# Patient Record
Sex: Female | Born: 1940 | Race: White | Hispanic: No | Marital: Married | Smoking: Never smoker
Health system: Southern US, Community
[De-identification: ages and names within clinical notes are randomized; demographics above are authoritative.]

## PROBLEM LIST (undated history)

## (undated) DIAGNOSIS — I10 Essential (primary) hypertension: Secondary | ICD-10-CM

## (undated) DIAGNOSIS — E7800 Pure hypercholesterolemia, unspecified: Secondary | ICD-10-CM

---

## 2019-04-06 ENCOUNTER — Other Ambulatory Visit: Payer: Self-pay

## 2019-04-06 ENCOUNTER — Encounter (HOSPITAL_BASED_OUTPATIENT_CLINIC_OR_DEPARTMENT_OTHER): Payer: Self-pay | Admitting: *Deleted

## 2019-04-06 ENCOUNTER — Emergency Department (HOSPITAL_BASED_OUTPATIENT_CLINIC_OR_DEPARTMENT_OTHER)
Admission: EM | Admit: 2019-04-06 | Discharge: 2019-04-06 | Disposition: A | Discharge status: Home / Self Care | Payer: Medicare Other | Attending: Emergency Medicine | Admitting: Emergency Medicine

## 2019-04-06 ENCOUNTER — Emergency Department (HOSPITAL_BASED_OUTPATIENT_CLINIC_OR_DEPARTMENT_OTHER): Payer: Medicare Other

## 2019-04-06 DIAGNOSIS — Z79899 Other long term (current) drug therapy: Secondary | ICD-10-CM | POA: Insufficient documentation

## 2019-04-06 DIAGNOSIS — K1120 Sialoadenitis, unspecified: Secondary | ICD-10-CM | POA: Insufficient documentation

## 2019-04-06 DIAGNOSIS — R600 Localized edema: Secondary | ICD-10-CM | POA: Diagnosis present

## 2019-04-06 DIAGNOSIS — I10 Essential (primary) hypertension: Secondary | ICD-10-CM | POA: Diagnosis not present

## 2019-04-06 DIAGNOSIS — Z7902 Long term (current) use of antithrombotics/antiplatelets: Secondary | ICD-10-CM | POA: Insufficient documentation

## 2019-04-06 HISTORY — DX: Pure hypercholesterolemia, unspecified: E78.00

## 2019-04-06 HISTORY — DX: Essential (primary) hypertension: I10

## 2019-04-06 LAB — CBC WITH DIFFERENTIAL/PLATELET
Abs Immature Granulocytes: 0 10*3/uL (ref 0.00–0.07)
Basophils Absolute: 0 10*3/uL (ref 0.0–0.1)
Basophils Relative: 0 %
Eosinophils Absolute: 0.1 10*3/uL (ref 0.0–0.5)
Eosinophils Relative: 2 %
HCT: 35.6 % — ABNORMAL LOW (ref 36.0–46.0)
Hemoglobin: 12.2 g/dL (ref 12.0–15.0)
Immature Granulocytes: 0 %
Lymphocytes Relative: 24 %
Lymphs Abs: 0.9 10*3/uL (ref 0.7–4.0)
MCH: 33.6 pg (ref 26.0–34.0)
MCHC: 34.3 g/dL (ref 30.0–36.0)
MCV: 98.1 fL (ref 80.0–100.0)
Monocytes Absolute: 0.3 10*3/uL (ref 0.1–1.0)
Monocytes Relative: 9 %
Neutro Abs: 2.5 10*3/uL (ref 1.7–7.7)
Neutrophils Relative %: 65 %
Platelets: 154 10*3/uL (ref 150–400)
RBC: 3.63 MIL/uL — ABNORMAL LOW (ref 3.87–5.11)
RDW: 11.9 % (ref 11.5–15.5)
WBC: 3.8 10*3/uL — ABNORMAL LOW (ref 4.0–10.5)
nRBC: 0 % (ref 0.0–0.2)

## 2019-04-06 LAB — COMPREHENSIVE METABOLIC PANEL
ALT: 11 U/L (ref 0–44)
AST: 17 U/L (ref 15–41)
Albumin: 3.9 g/dL (ref 3.5–5.0)
Alkaline Phosphatase: 52 U/L (ref 38–126)
Anion gap: 7 (ref 5–15)
BUN: 15 mg/dL (ref 8–23)
CO2: 25 mmol/L (ref 22–32)
Calcium: 8.9 mg/dL (ref 8.9–10.3)
Chloride: 105 mmol/L (ref 98–111)
Creatinine, Ser: 0.8 mg/dL (ref 0.44–1.00)
GFR calc Af Amer: >60 mL/min (ref >60)
GFR calc non Af Amer: >60 mL/min (ref >60)
Glucose, Bld: 85 mg/dL (ref 70–99)
Potassium: 3.8 mmol/L (ref 3.5–5.1)
Sodium: 137 mmol/L (ref 135–145)
Total Bilirubin: 0.6 mg/dL (ref 0.3–1.2)
Total Protein: 7.6 g/dL (ref 6.5–8.1)

## 2019-04-06 MED ORDER — CLINDAMYCIN PHOSPHATE 600 MG/50ML IV SOLN: 600.0000 mg | Freq: Once | INTRAVENOUS | Status: DC

## 2019-04-06 MED ORDER — CLINDAMYCIN HCL 150 MG PO CAPS
300.0000 mg | ORAL_CAPSULE | Freq: Once | ORAL | Status: AC
  Administered 2019-04-06: 300 mg via ORAL
  Filled 2019-04-06: qty 2

## 2019-04-06 MED ORDER — SODIUM CHLORIDE 0.9 % IV BOLUS
1000.0000 mL | Freq: Once | INTRAVENOUS | Status: AC
  Administered 2019-04-06: 19:00:00 1000 mL via INTRAVENOUS

## 2019-04-06 MED ORDER — CLINDAMYCIN HCL 300 MG PO CAPS: 300.0000 mg | ORAL_CAPSULE | Freq: Three times a day (TID) | ORAL | 0 refills | Status: AC

## 2019-04-06 MED ORDER — IOHEXOL 300 MG/ML  SOLN
100.0000 mL | Freq: Once | INTRAMUSCULAR | Status: AC | PRN
  Administered 2019-04-06: 20:00:00 75 mL via INTRAVENOUS

## 2019-04-06 NOTE — Discharge Instructions (Signed)
Take clindamycin three times daily for a week   Your parotid gland is inflammed   See your doctor  Return to ER if you have worse neck swelling, severe pain, trouble swallowing

## 2019-04-06 NOTE — ED Triage Notes (Signed)
Knot behind her right ear. Headache. Nausea.

## 2019-04-06 NOTE — ED Notes (Signed)
Patient transported to X-ray 

## 2019-04-06 NOTE — ED Provider Notes (Signed)
[facility] EMERGENCY DEPARTMENT Provider Note   CSN: [id number] Arrival date & time: 04/06/19  1836    History   Chief Complaint No chief complaint on file.   HPI Lori Yates is a 78 y.o. female history of hypertension, high cholesterol here presenting with right jaw pain.  Patient states that she noticed swelling behind her ear and her jaw area today.  She states that she has some subjective trouble swallowing food but able to tolerate liquids.  Patient has some nausea and headaches but denies any vomiting or weakness or numbness or trouble speaking.     The history is provided by the patient.    Past Medical History:  Diagnosis Date  . High cholesterol   . Hypertension     There are no active problems to display for this patient.   History reviewed. No pertinent surgical history.   OB History   No obstetric history on file.      Home Medications    Prior to Admission medications   Medication Sig Start Date End Date Taking? Authorizing Provider  atorvastatin (LIPITOR) 20 MG tablet TAKE 1 TABLET BY MOUTH NIGHTLY FOR CHOLESTEROL 03/07/19  Yes [provider]  carbamazepine (TEGRETOL) 100 MG chewable tablet Chew by mouth. 12/21/18  Yes [provider]  clopidogrel (PLAVIX) 75 MG tablet TAKE 1 TABLET BY MOUTH EVERY DAY 01/29/19  Yes [provider]  lisinopril (ZESTRIL) 20 MG tablet Take by mouth. 02/06/19 02/06/20 Yes [provider]  metoprolol tartrate (LOPRESSOR) 50 MG tablet TAKE 1 TABLET BY MOUTH 2 TIMES A DAY 02/22/19  Yes [provider]  potassium chloride SA (K-DUR) 20 MEQ tablet TAKE 1/2 TABLET BY MOUTH TWICE PER DAY 11/27/18  Yes [provider]    Family History No family history on file.  Social History Social History   Tobacco Use  . Smoking status: Never Smoker  . Smokeless tobacco: Never Used  Substance Use Topics  . Alcohol use: Not Currently    Frequency: Never  . Drug use: Never      Allergies   Penicillin g   Review of Systems Review of Systems  HENT: Positive for facial swelling and trouble swallowing.   All other systems reviewed and are negative.    Physical Exam Updated Vital Signs BP (!) 182/97   Pulse 80   Temp 97.8 F (36.6 C) (Oral)   Resp 20   Ht 5\' 2"  (1.575 m)   Wt 65.8 kg   SpO2 100%   BMI 26.52 kg/m   Physical Exam Vitals signs and nursing note reviewed.  HENT:     Head: Normocephalic.     Right Ear: Tympanic membrane normal.     Left Ear: Tympanic membrane normal.     Ears:     Comments: No obvious otitis media     Mouth/Throat:     Mouth: Mucous membranes are moist.     Comments: Good dentition overall. No obvious periapical abscess, floor of mouth soft nontender  Eyes:     Extraocular Movements: Extraocular movements intact.     Pupils: Pupils are equal, round, and reactive to light.  Neck:     Musculoskeletal: Normal range of motion.     Comments: Tender lymph node ankle of the jaw on R side  Cardiovascular:     Rate and Rhythm: Normal rate.     Pulses: Normal pulses.  Pulmonary:     Effort: Pulmonary effort is normal.  Abdominal:  General: Abdomen is flat.     Palpations: Abdomen is soft.  Musculoskeletal: Normal range of motion.  Skin:    General: Skin is warm.     Capillary Refill: Capillary refill takes less than 2 seconds.  Neurological:     General: No focal deficit present.     Mental Status: She is alert and oriented to person, place, and time.  Psychiatric:        Mood and Affect: Mood normal.        Behavior: Behavior normal.      ED Treatments / Results  Labs (all labs ordered are listed, but only abnormal results are displayed) Labs Reviewed  CBC WITH DIFFERENTIAL/PLATELET - Abnormal; Notable for the following components:      Result Value   WBC 3.8 (*)    RBC 3.63 (*)    HCT 35.6 (*)    All other components within normal limits  COMPREHENSIVE METABOLIC PANEL    EKG None   Radiology No results found.  Procedures Procedures (including critical care time)  Medications Ordered in ED Medications  sodium chloride 0.9 % bolus 1,000 mL (1,000 mLs Intravenous New Bag/Given 04/06/19 1926)     Initial Impression / Assessment and Plan / ED Course  I have reviewed the triage vital signs and the nursing notes.  Pertinent labs & imaging results that were available during my care of the patient were reviewed by me and considered in my medical decision making (see chart for details).       Lori Yates is a 78 y.o. female here with R sided neck swelling. No obvious otitis media. Consider salivary duct stone vs parotitis. No obvious periapical abscess. Given that she has subjective trouble swallowing, will get labs, CT neck to r/o mass vs deep space infection.   8:44 PM Labs unremarkable. CT showed parotitis with no stone. Likely viral in etiology and she is up to date with tdap. Will give clindamycin empirically. Stable for discharge.    Final Clinical Impressions(s) / ED Diagnoses   Final diagnoses:  None    ED Discharge Orders    None       Charlynne PanderYao, David Hsienta, MD 04/06/19 2046

## 2019-04-06 NOTE — ED Notes (Signed)
Patient transported to CT 

## 2019-04-06 NOTE — ED Notes (Signed)
Pt was able to ambulate to restroom without assistance. NAD noted 

## 2019-04-06 NOTE — ED Notes (Signed)
Pt understood dc material. NAD noted. Script given at dc. All questions answered to satisfaction. Pt escorted to check out window. 

## 2019-04-06 NOTE — ED Notes (Signed)
Pt requested not to finish IVF. States she has a terrible time with her bladder. EDP Silverio Lay notified and stated we could discontinue the remainder of the fluids.

## 2020-11-04 IMAGING — CT CT NECK WITH CONTRAST
3 of 4 series · 14 of 33 positions shown, 17 images · IV contrast (omnipaque)
Comparison: None.

CLINICAL DATA: Right-sided facial/jaw swelling. Nausea. Pain when
eating.

EXAM:
CT NECK WITH CONTRAST
TECHNIQUE: Multidetector CT imaging of the neck was performed using the
standard protocol following the bolus administration of intravenous
contrast.
CONTRAST:  75mL OMNIPAQUE IOHEXOL 300 MG/ML  SOLN

[Series 6: sag neck · sagittal · 0.47mm/px · 5 of 123 slices shown, 6 images]
[im 41/123  bone]
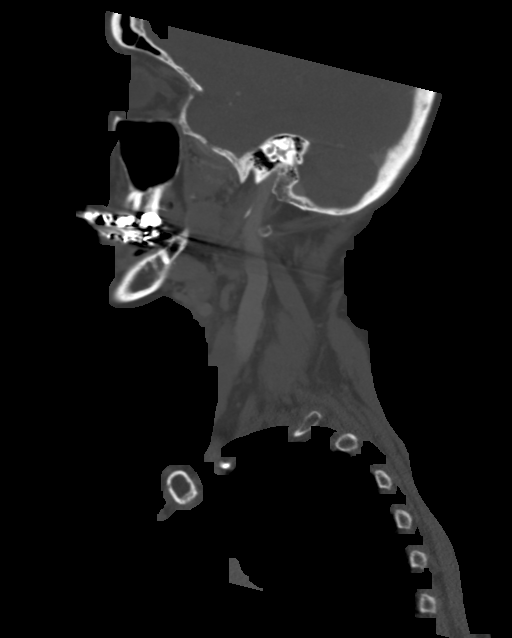
[im 51/123  bone]
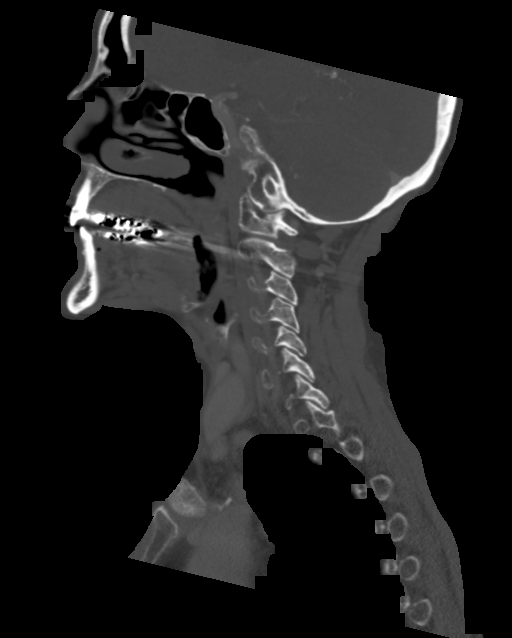
[im 62/123  soft-tissue]
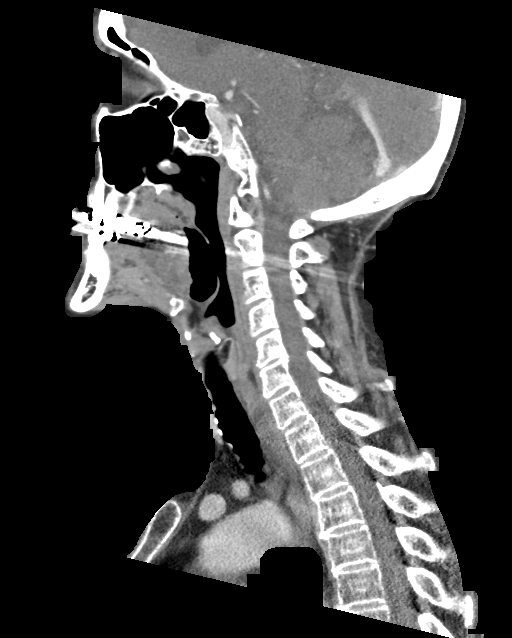
[im 62/123  bone]
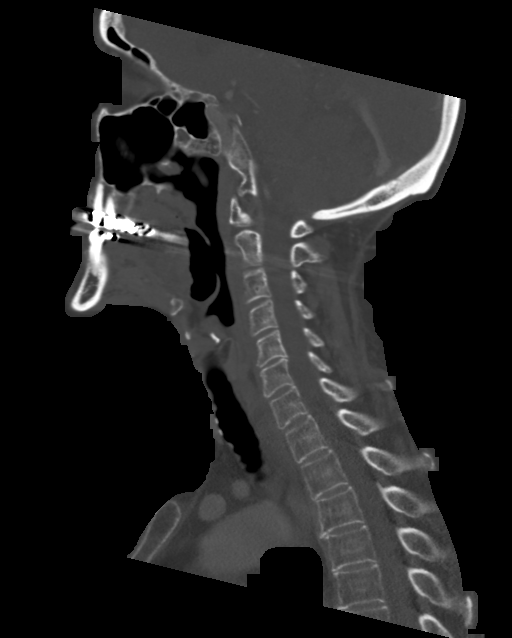
[im 72/123  bone]
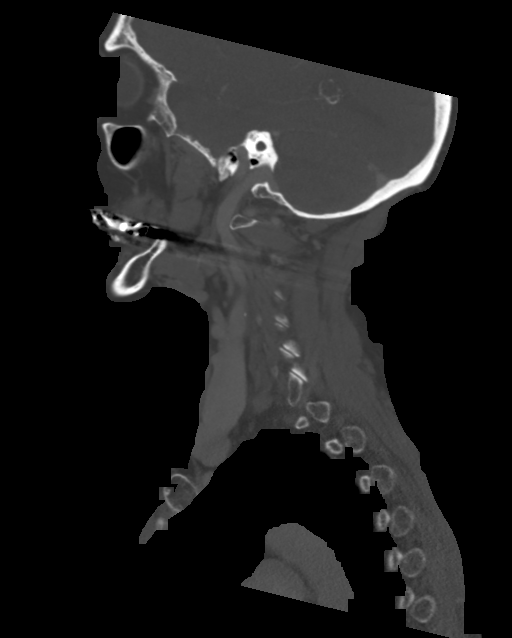
[im 82/123  bone]
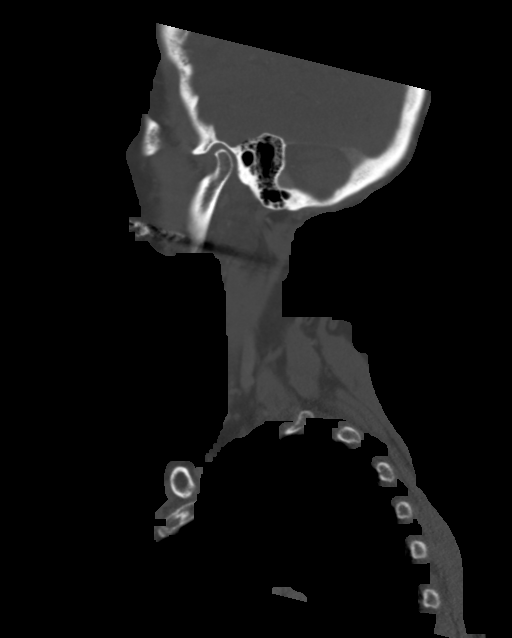

[Series 7: cor neck · coronal · 0.48mm/px · 3 of 100 slices shown]
[im 20/100  bone]
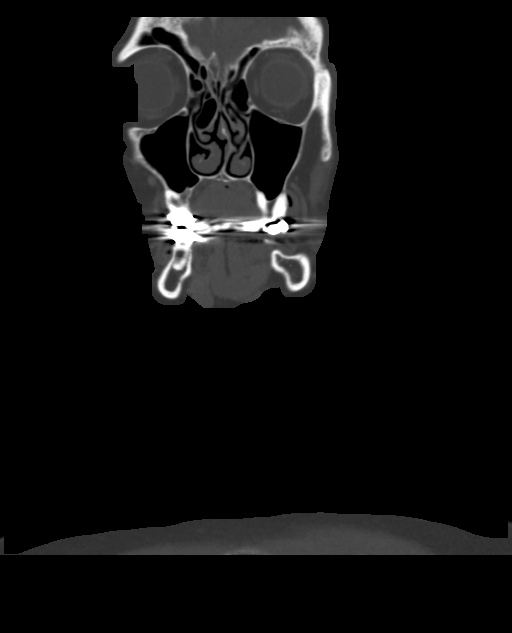
[im 40/100  bone]
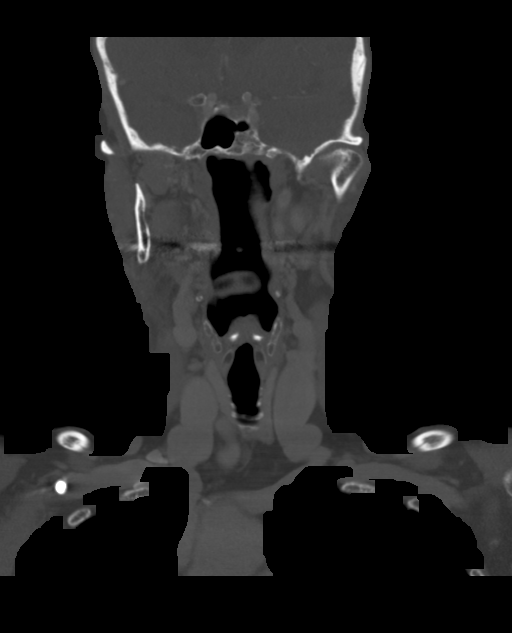
[im 60/100  bone]
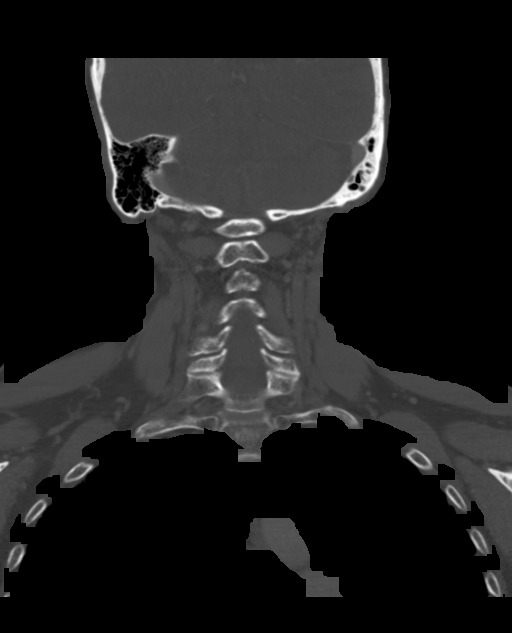

[Series 8: orthogonal ax · axial · 0.47mm/px · z∈[+954,+1163]mm · 6 of 152 slices shown, 8 images]
[im 22/152  soft-tissue]
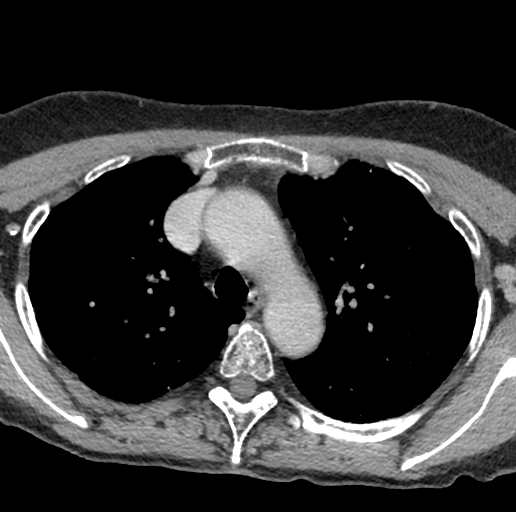
[im 22/152  bone]
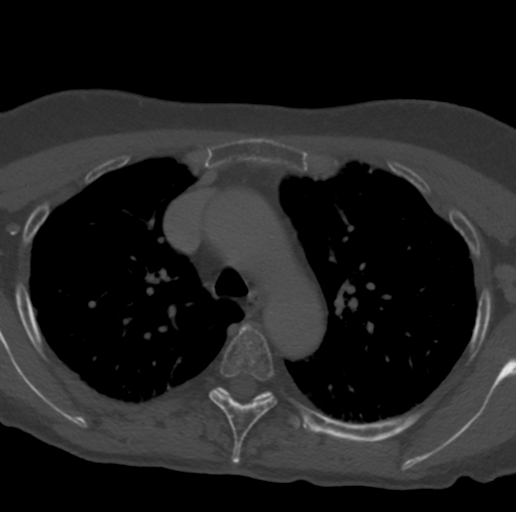
[im 44/152  bone]
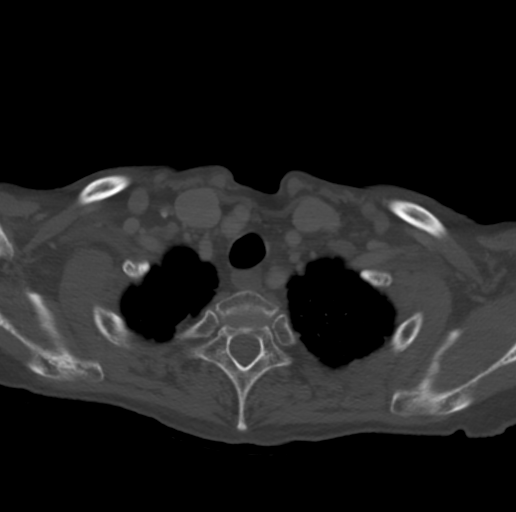
[im 65/152  bone]
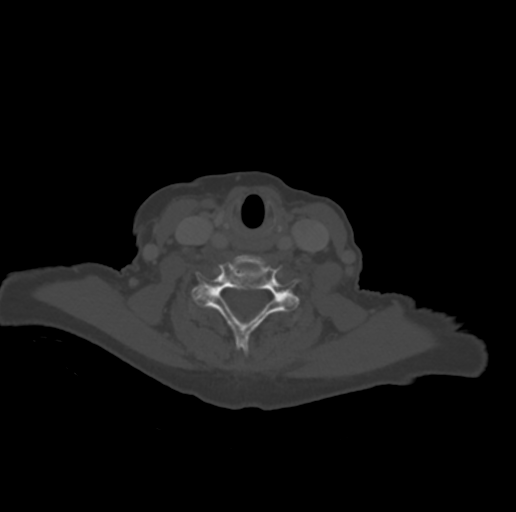
[im 87/152  bone]
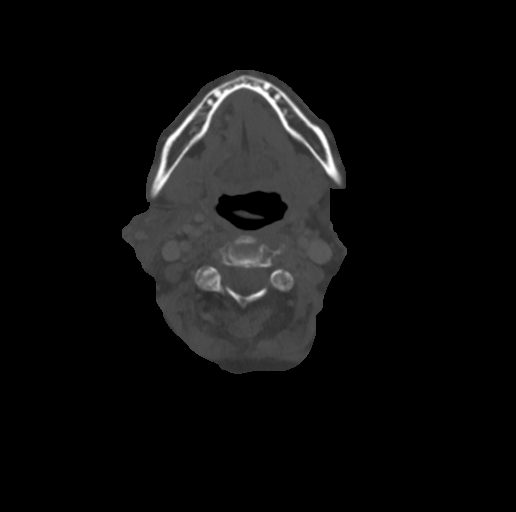
[im 108/152  soft-tissue]
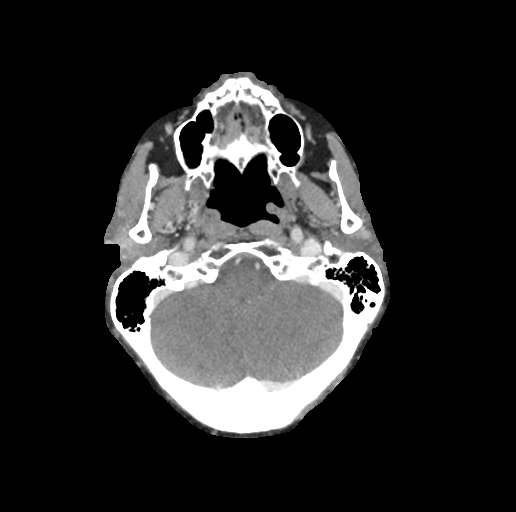
[im 108/152  bone]
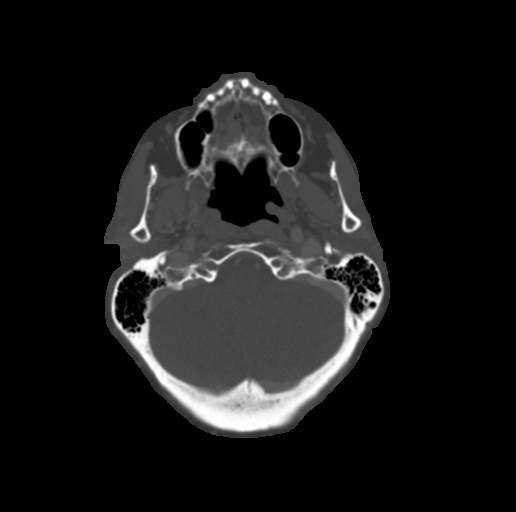
[im 130/152  bone]
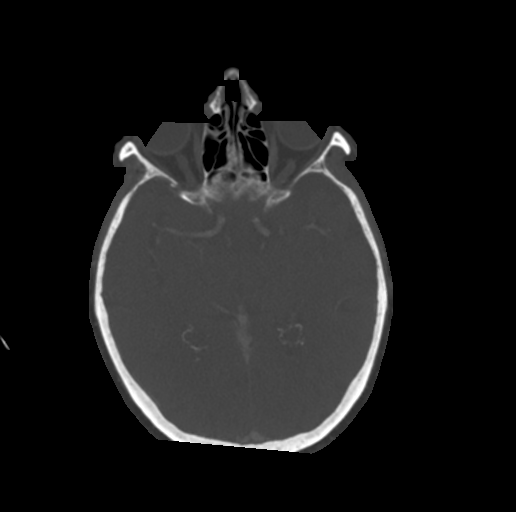

[14 of 33 positions shown; findings below may reference images not displayed]

FINDINGS: Pharynx and larynx: No evidence of pharyngeal mass or swelling
within limitations of metallic dental streak artifact. Unremarkable
larynx. Patent airway.

Salivary glands: The right parotid gland is asymmetrically enlarged
with heterogeneous hyperenhancement and surrounding inflammatory
stranding which extends inferiorly into the upper neck. No stone or
abscess is identified. The left parotid and both submandibular
glands are unremarkable.

Thyroid: Unremarkable.

Lymph nodes: No enlarged or suspicious lymph nodes in the neck.

Vascular: Major vascular structures of the neck are patent with mild
carotid atherosclerosis noted.

Limited intracranial: Unremarkable.

Visualized orbits: Unremarkable.

Mastoids and visualized paranasal sinuses: Clear.

Skeleton: No acute osseous abnormality or suspicious osseous lesion.

Upper chest: Mild subpleural reticulation in the lung apices. No
consolidation or mass.

Other: None.
IMPRESSION: Acute right parotitis.  No stone or abscess.
